# Patient Record
Sex: Male | Born: 1975 | Race: White | Hispanic: No | Marital: Single
Health system: Southern US, Community
[De-identification: ages and names within clinical notes are randomized; demographics above are authoritative.]

---

## 2013-05-29 ENCOUNTER — Ambulatory Visit
Admission: RE | Admit: 2013-05-29 | Discharge: 2013-05-29 | Disposition: A | Discharge status: Home / Self Care | Payer: 59 | Source: Ambulatory Visit | Attending: Pain Medicine | Admitting: Pain Medicine

## 2013-05-29 ENCOUNTER — Other Ambulatory Visit: Payer: Self-pay | Admitting: Pain Medicine

## 2013-05-29 DIAGNOSIS — R52 Pain, unspecified: Secondary | ICD-10-CM

## 2013-09-23 ENCOUNTER — Other Ambulatory Visit: Payer: Self-pay | Admitting: Pain Medicine

## 2013-09-23 DIAGNOSIS — M542 Cervicalgia: Secondary | ICD-10-CM

## 2013-09-23 DIAGNOSIS — M545 Low back pain, unspecified: Secondary | ICD-10-CM

## 2013-09-28 ENCOUNTER — Ambulatory Visit
Admission: RE | Admit: 2013-09-28 | Discharge: 2013-09-28 | Disposition: A | Discharge status: Home / Self Care | Payer: 59 | Source: Ambulatory Visit | Attending: Pain Medicine | Admitting: Pain Medicine

## 2013-09-28 DIAGNOSIS — M545 Low back pain, unspecified: Secondary | ICD-10-CM

## 2013-09-28 DIAGNOSIS — M542 Cervicalgia: Secondary | ICD-10-CM

## 2015-03-14 IMAGING — CR DG LUMBAR SPINE COMPLETE W/ BEND
7 series · 7 of 7 positions shown · non-contrast
Comparison: None.

CLINICAL DATA: Back pain.  No specific injury.

EXAM:
LUMBAR SPINE - COMPLETE WITH BENDING VIEWS

[view not recorded (1 of 7)]
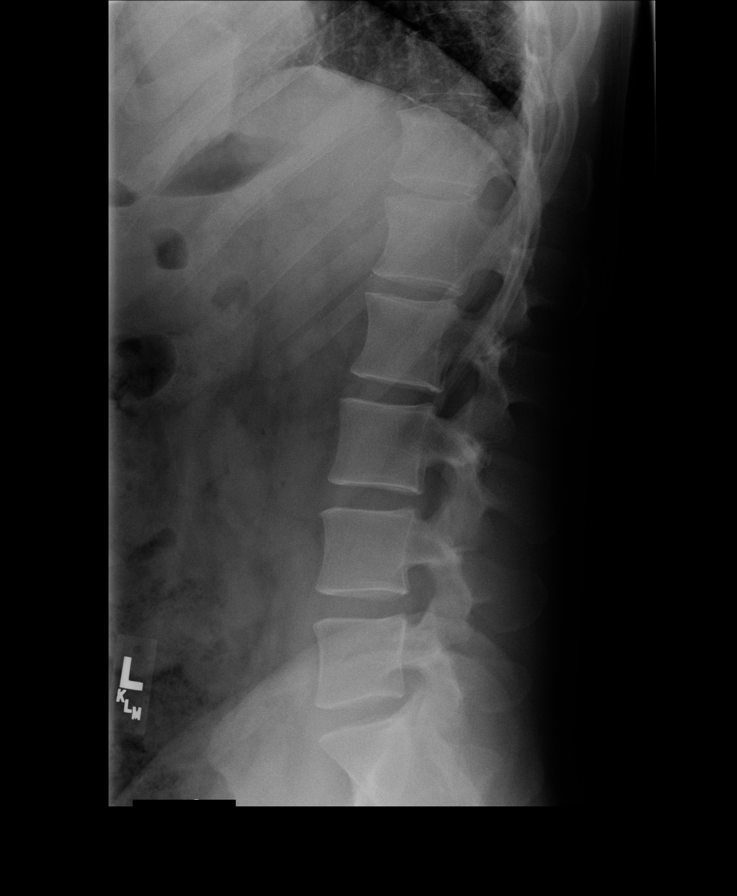

[view not recorded (2 of 7)]
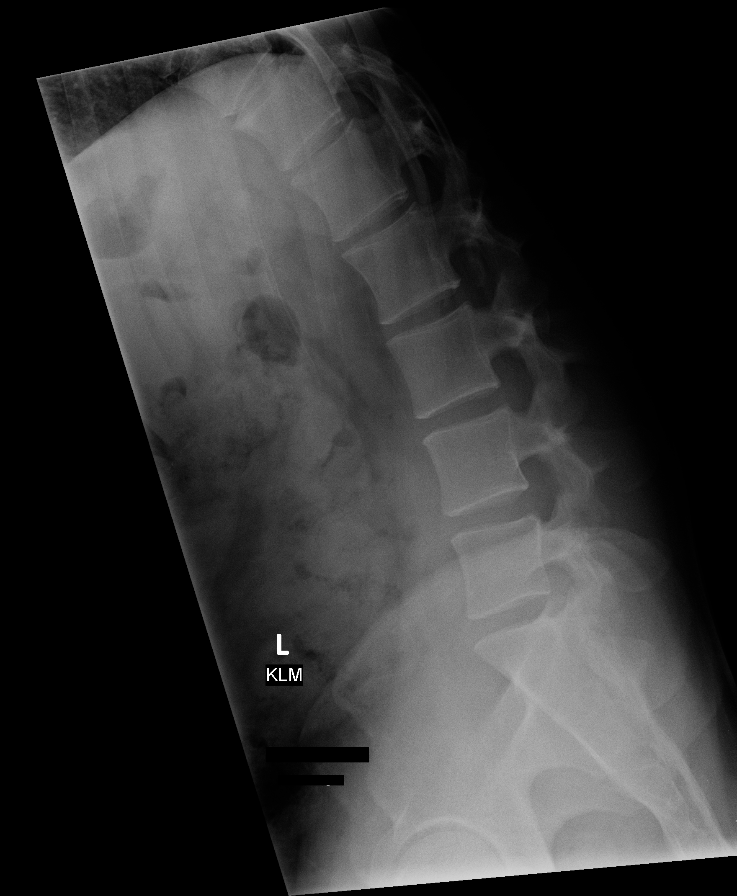

[view not recorded (3 of 7)]
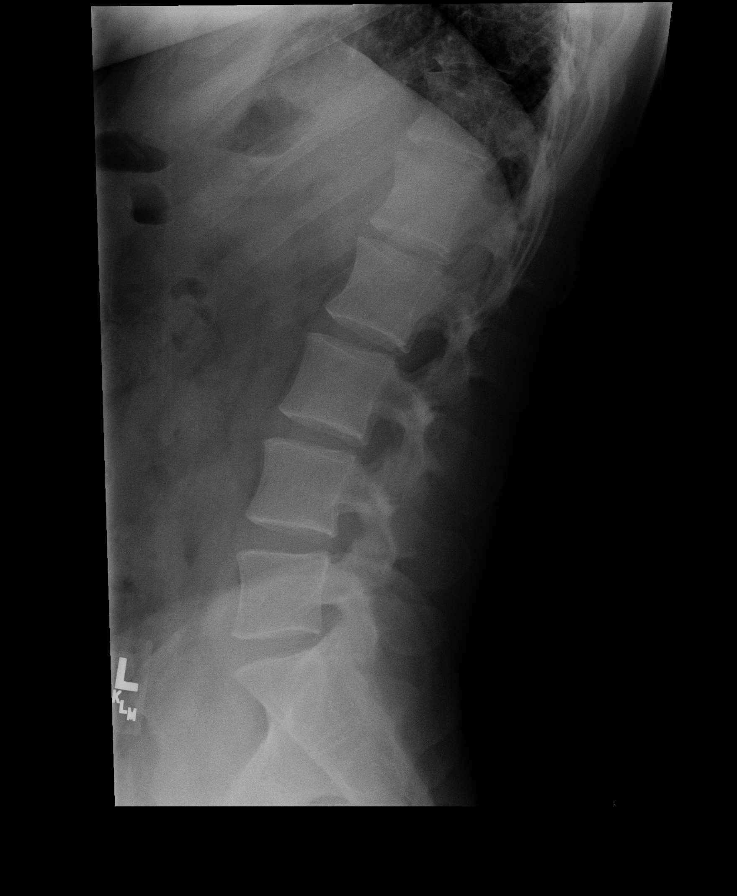

[view not recorded (4 of 7)]
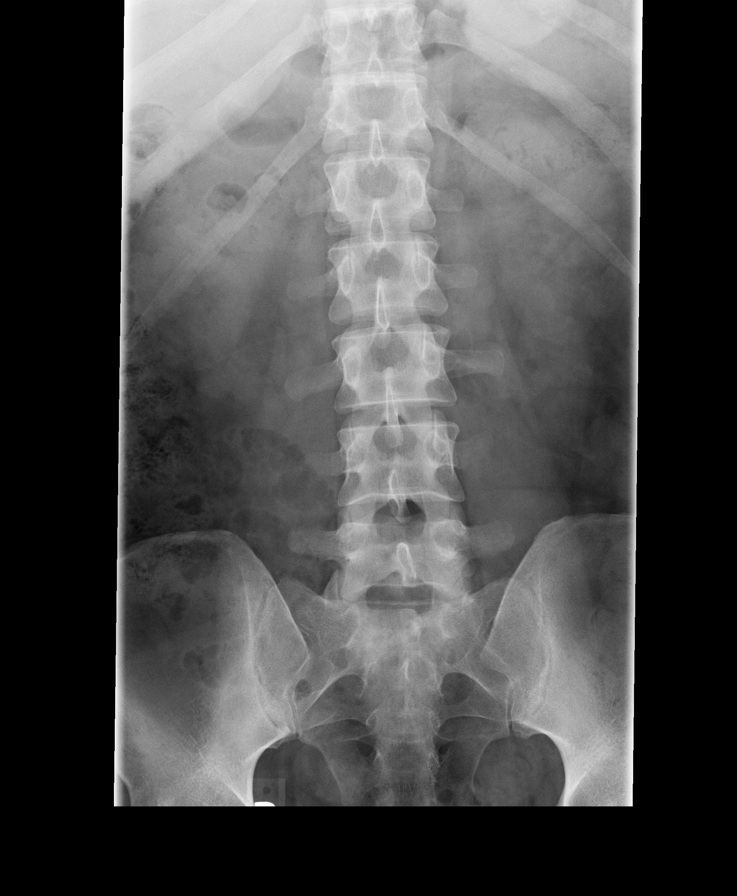

[view not recorded (5 of 7)]
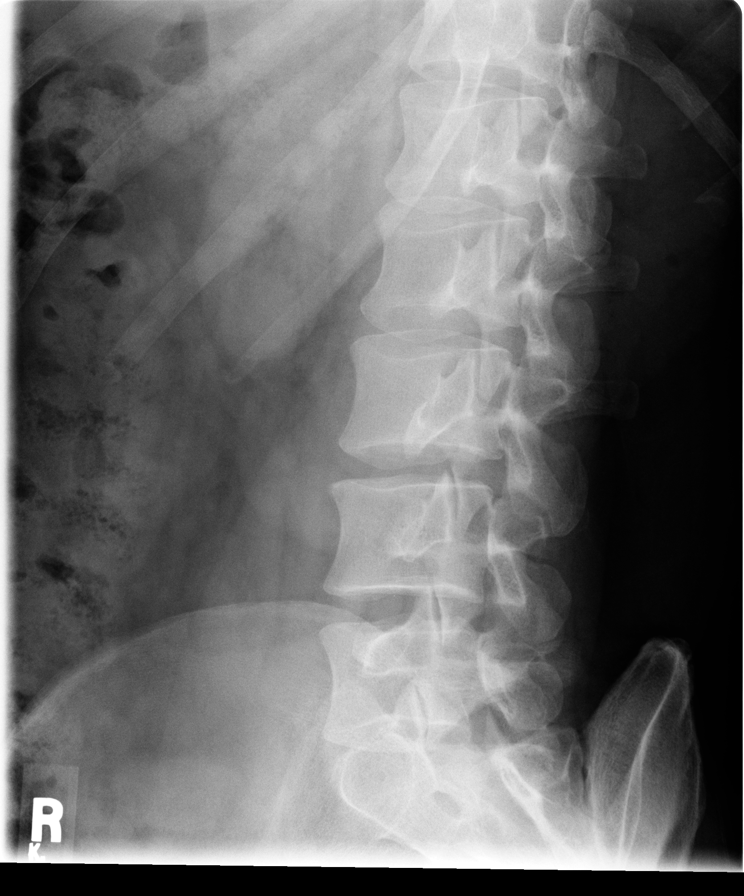

[view not recorded (6 of 7)]
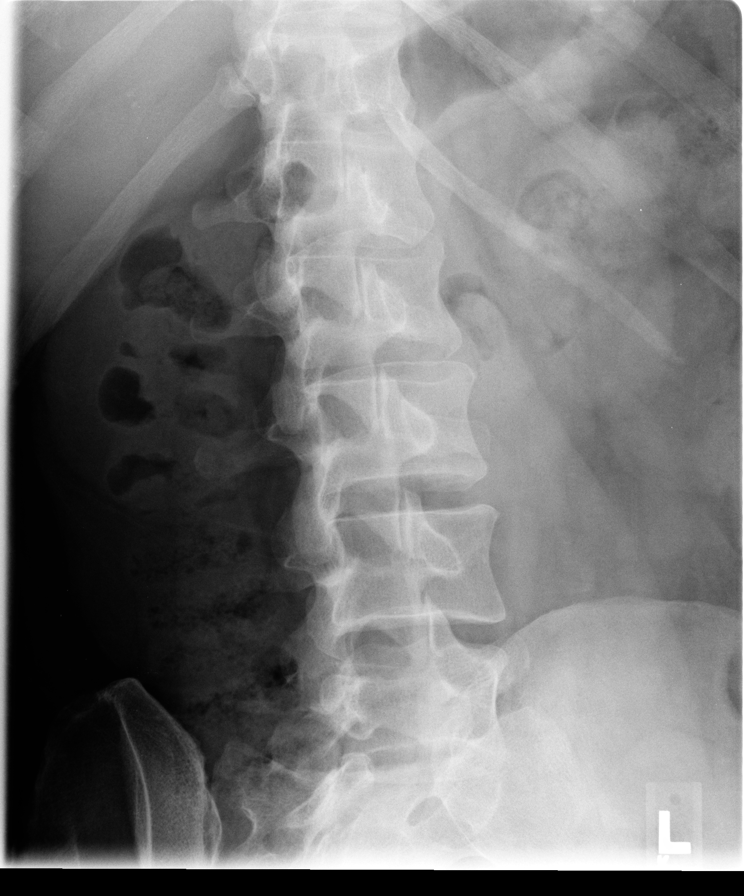

[view not recorded (7 of 7)]
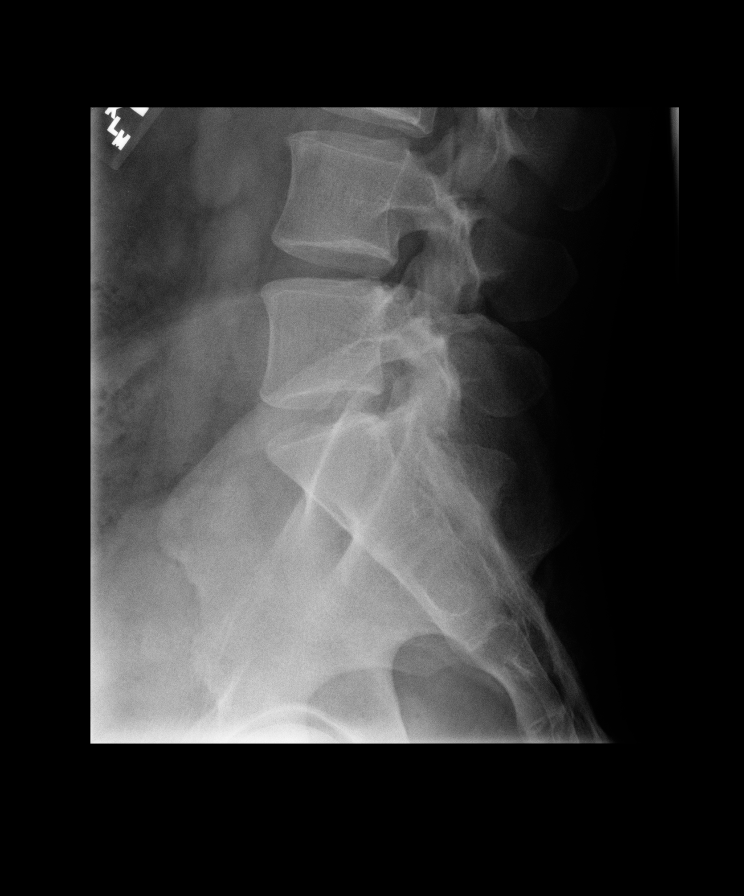

[7 of 7 positions shown; findings below may reference images not displayed]

FINDINGS: Straightening of the normal lumbar lordosis. There is no fracture or
spondylolisthesis.

No degenerative change. Disc spaces and facet joints are well
preserved.

No subluxation with flexion or extension.

Normal soft tissues.
IMPRESSION: Straightening of the normal lumbar lordosis.  No other abnormality.
